# Patient Record
Sex: Male | Born: 1975 | Race: White | Hispanic: No | Marital: Married | Smoking: Current some day smoker
Health system: Southern US, Community
[De-identification: ages and names within clinical notes are randomized; demographics above are authoritative.]

## PROBLEM LIST (undated history)

## (undated) DIAGNOSIS — Z940 Kidney transplant status: Secondary | ICD-10-CM

## (undated) HISTORY — PX: KIDNEY TRANSPLANT: SHX239

## (undated) HISTORY — DX: Kidney transplant status: Z94.0

---

## 1997-10-02 ENCOUNTER — Encounter (HOSPITAL_COMMUNITY): Admission: RE | Admit: 1997-10-02 | Discharge: 1997-12-31 | Payer: Self-pay | Admitting: Nephrology

## 1998-02-11 ENCOUNTER — Ambulatory Visit (HOSPITAL_COMMUNITY): Admission: RE | Admit: 1998-02-11 | Discharge: 1998-02-11 | Payer: Self-pay

## 1998-02-22 ENCOUNTER — Other Ambulatory Visit: Admission: RE | Admit: 1998-02-22 | Discharge: 1998-02-22 | Payer: Self-pay

## 1998-03-22 ENCOUNTER — Encounter (HOSPITAL_COMMUNITY): Admission: RE | Admit: 1998-03-22 | Discharge: 1998-06-20 | Payer: Self-pay

## 1998-11-29 ENCOUNTER — Encounter (HOSPITAL_COMMUNITY): Admission: RE | Admit: 1998-11-29 | Discharge: 1999-01-06 | Payer: Self-pay | Admitting: Nephrology

## 1999-01-09 ENCOUNTER — Encounter (HOSPITAL_COMMUNITY): Admission: RE | Admit: 1999-01-09 | Discharge: 1999-04-09 | Payer: Self-pay

## 2004-11-30 ENCOUNTER — Emergency Department (HOSPITAL_COMMUNITY): Admission: EM | Admit: 2004-11-30 | Discharge: 2004-11-30 | Payer: Self-pay | Admitting: Family Medicine

## 2005-01-05 ENCOUNTER — Emergency Department (HOSPITAL_COMMUNITY): Admission: EM | Admit: 2005-01-05 | Discharge: 2005-01-05 | Payer: Self-pay | Admitting: Family Medicine

## 2005-03-29 ENCOUNTER — Emergency Department (HOSPITAL_COMMUNITY): Admission: EM | Admit: 2005-03-29 | Discharge: 2005-03-29 | Payer: Self-pay | Admitting: Family Medicine

## 2005-05-08 ENCOUNTER — Emergency Department (HOSPITAL_COMMUNITY): Admission: EM | Admit: 2005-05-08 | Discharge: 2005-05-08 | Payer: Self-pay | Admitting: *Deleted

## 2006-05-01 ENCOUNTER — Emergency Department (HOSPITAL_COMMUNITY): Admission: EM | Admit: 2006-05-01 | Discharge: 2006-05-01 | Payer: Self-pay | Admitting: Family Medicine

## 2006-11-20 ENCOUNTER — Emergency Department (HOSPITAL_COMMUNITY): Admission: EM | Admit: 2006-11-20 | Discharge: 2006-11-20 | Payer: Self-pay | Admitting: Family Medicine

## 2007-02-12 ENCOUNTER — Emergency Department (HOSPITAL_COMMUNITY): Admission: EM | Admit: 2007-02-12 | Discharge: 2007-02-12 | Payer: Self-pay | Admitting: Emergency Medicine

## 2007-05-16 ENCOUNTER — Emergency Department (HOSPITAL_COMMUNITY): Admission: EM | Admit: 2007-05-16 | Discharge: 2007-05-16 | Payer: Self-pay | Admitting: Emergency Medicine

## 2008-05-02 ENCOUNTER — Emergency Department (HOSPITAL_COMMUNITY): Admission: EM | Admit: 2008-05-02 | Discharge: 2008-05-02 | Payer: Self-pay | Admitting: Family Medicine

## 2008-06-21 IMAGING — CR DG ANKLE COMPLETE 3+V*L*
3 series · 3 of 3 positions shown · non-contrast
Comparison: none

CLINICAL DATA: Foot pain. Some trauma, left ankle.
 LEFT ANKLE - 3 VIEW:

[view not recorded (1 of 3)]
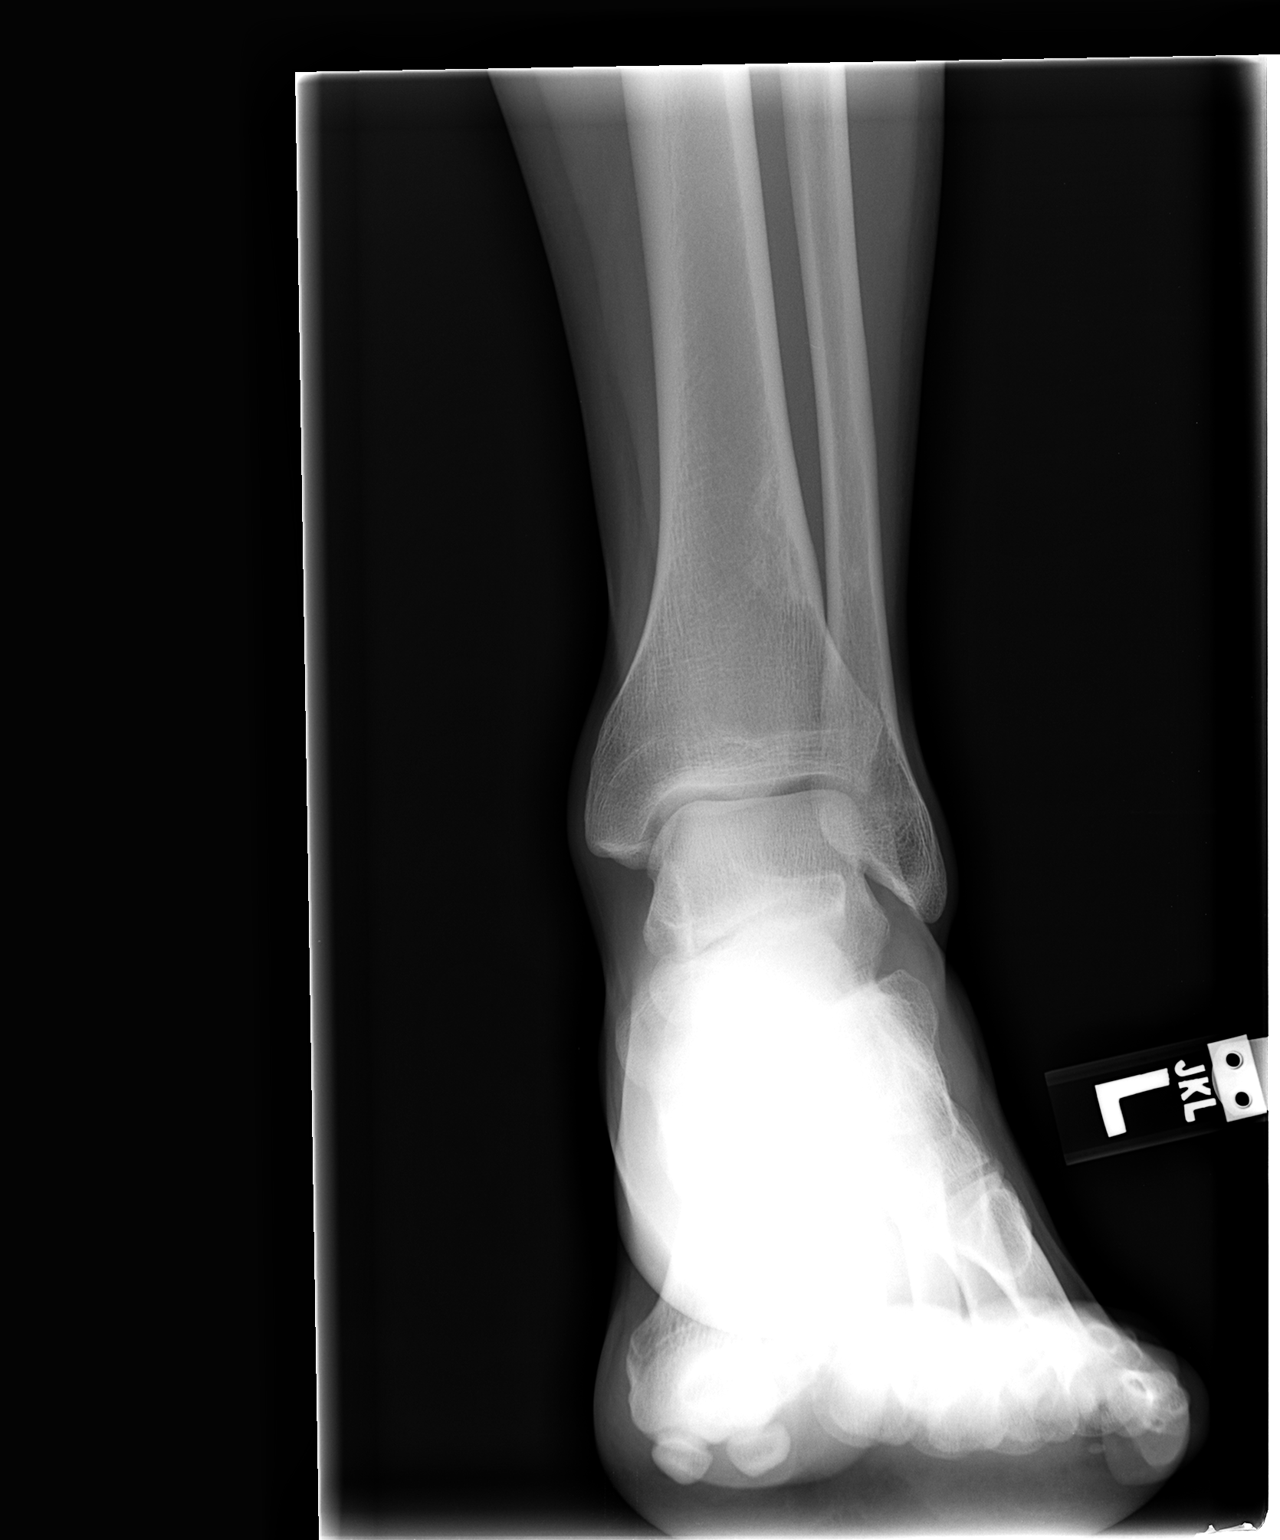

[view not recorded (2 of 3)]
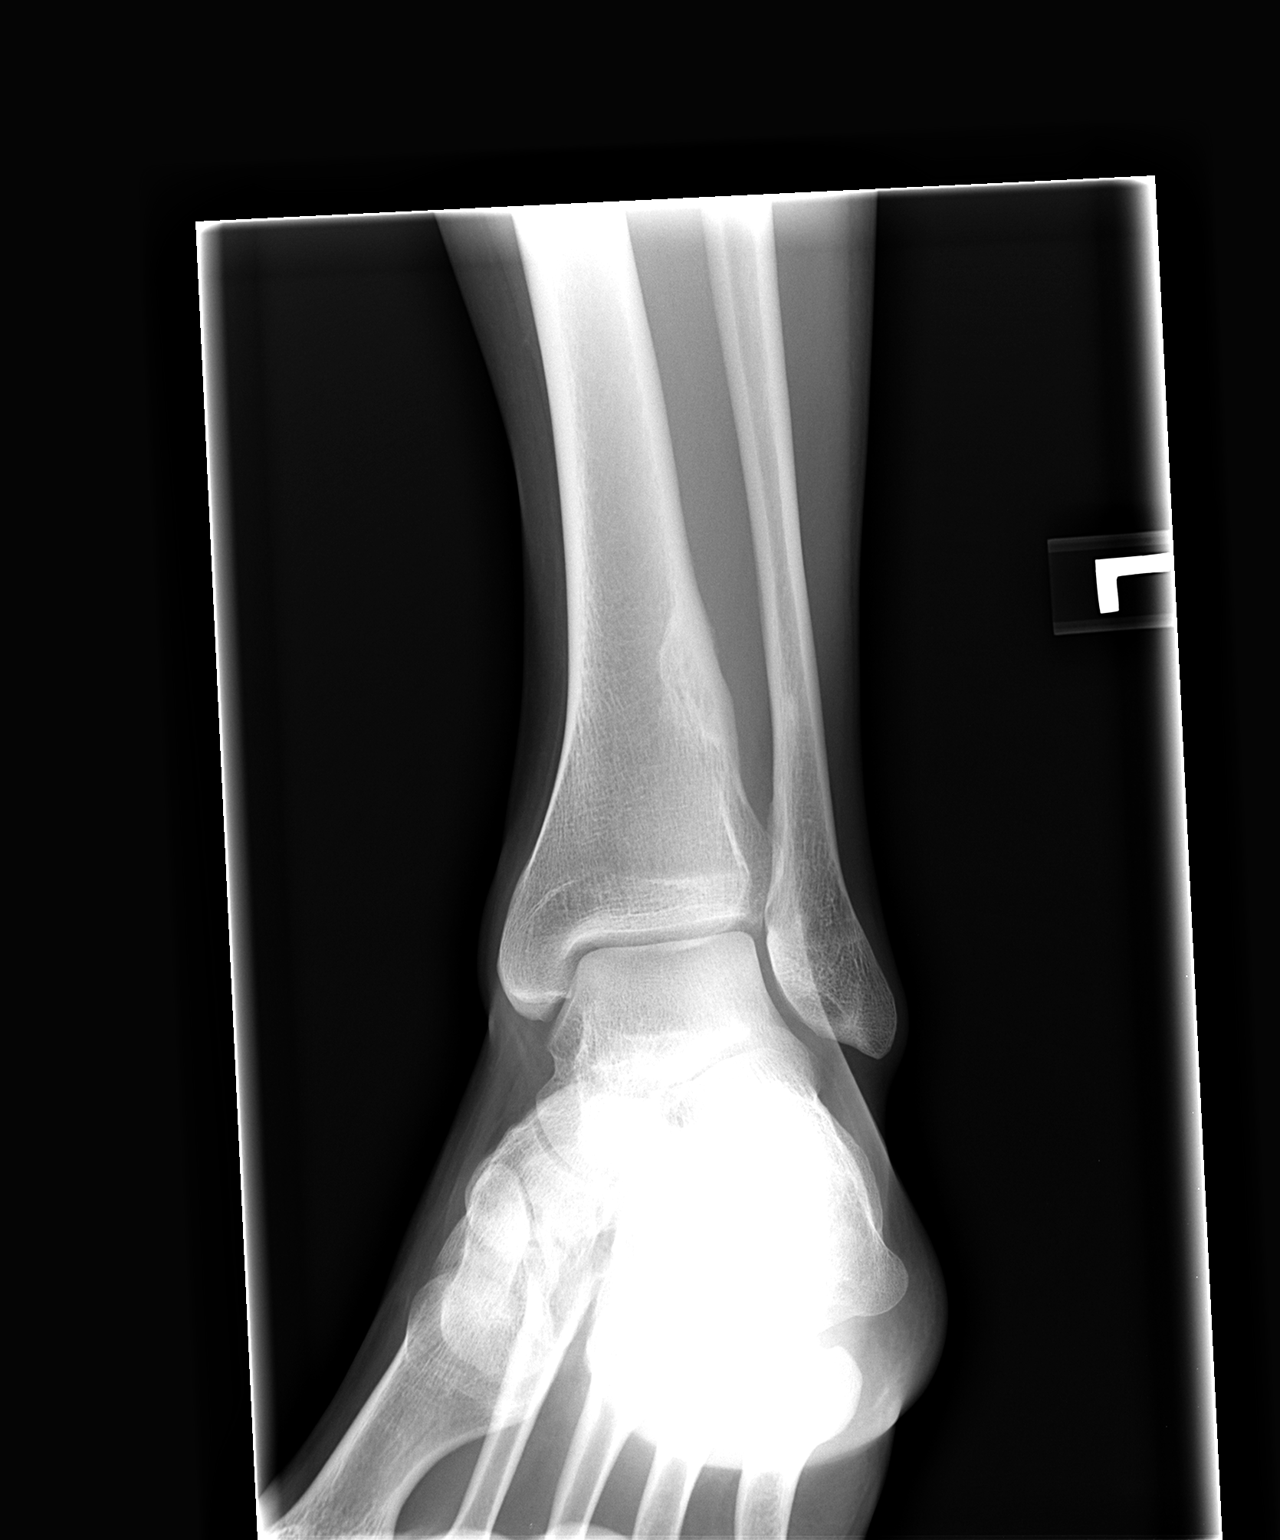

[view not recorded (3 of 3)]
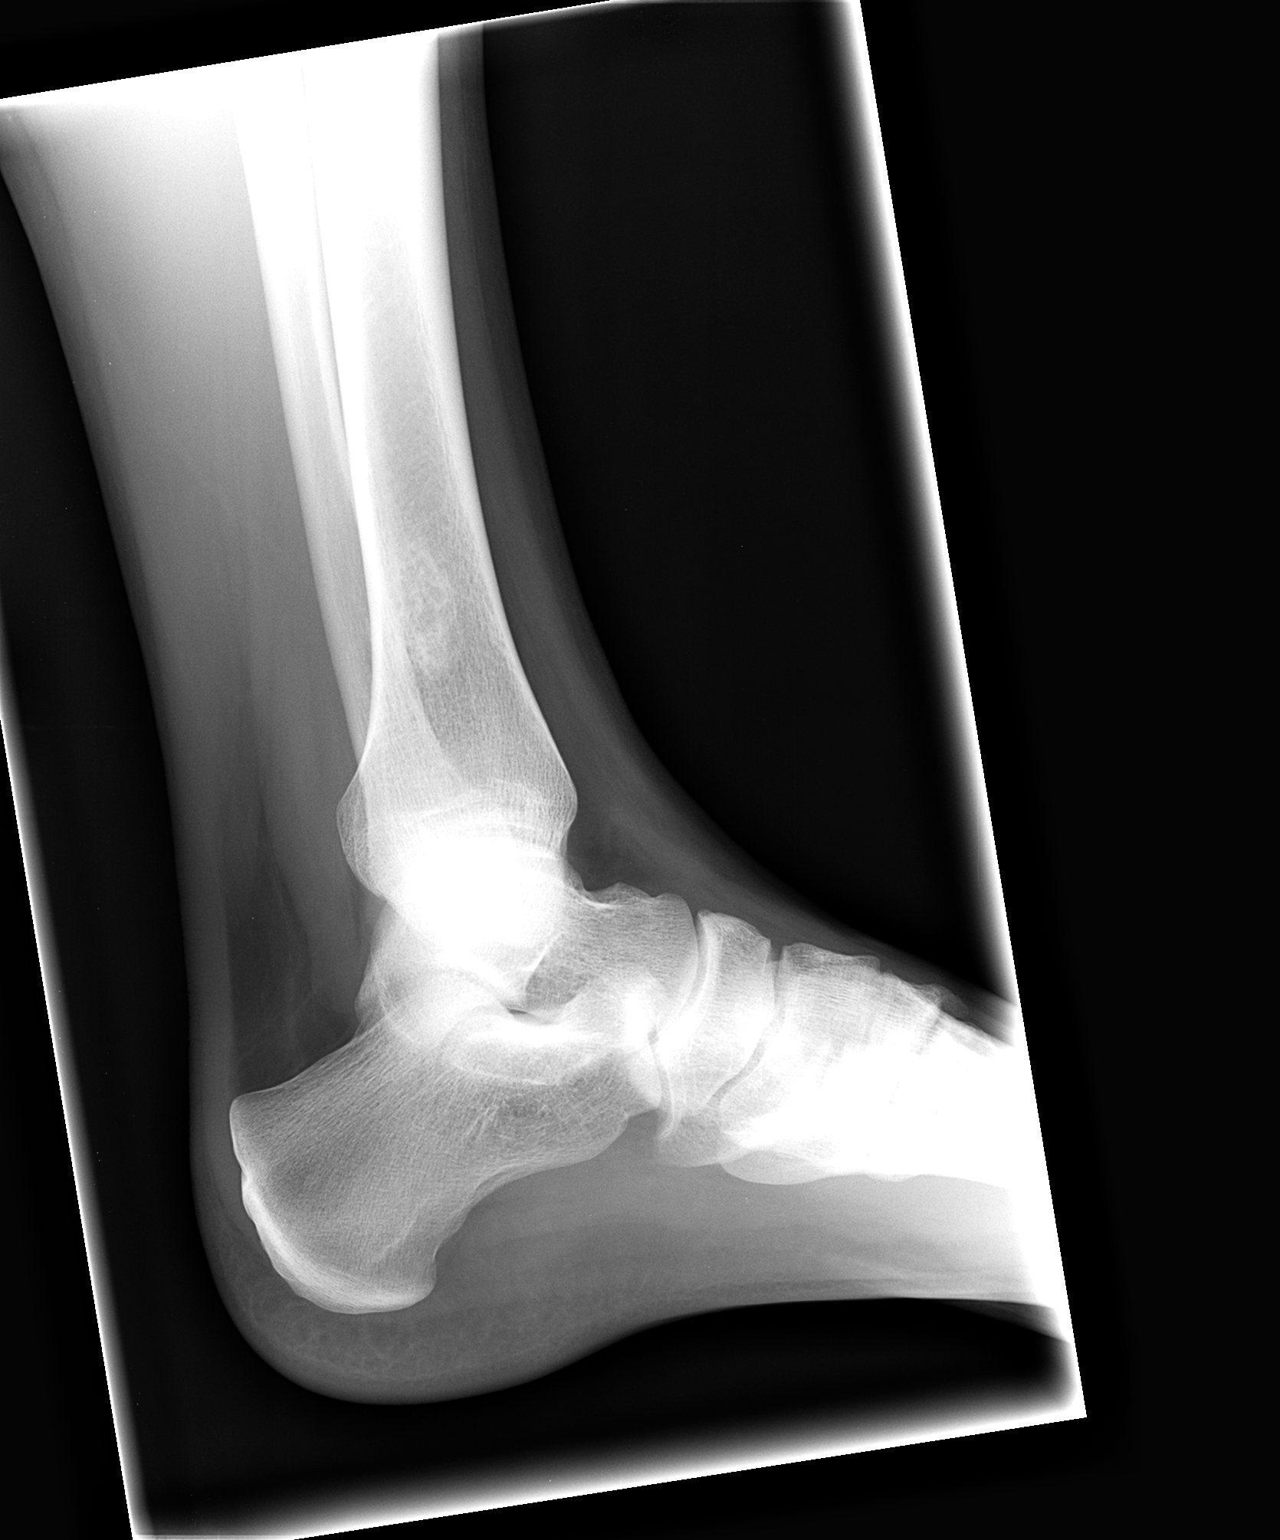

[3 of 3 positions shown; findings below may reference images not displayed]

FINDINGS: Three views of the left ankle were obtained. No acute bony abnormality is seen. The ankle joint appears normal. Thickening of the cortex of the distal medial left tibia is consistent with a benign fibrous cortical defect.
IMPRESSION: No acute abnormality.

## 2009-09-17 ENCOUNTER — Ambulatory Visit: Payer: Self-pay | Admitting: Family Medicine

## 2009-09-17 DIAGNOSIS — J019 Acute sinusitis, unspecified: Secondary | ICD-10-CM

## 2009-12-02 IMAGING — CR DG CHEST 2V
2 series · 2 of 2 positions shown · non-contrast
Comparison: Chest radiographs 05/08/2005.

CLINICAL DATA: Left chest pain and productive cough for 1 week.

CHEST - 2 VIEW

[view not recorded (1 of 2)]
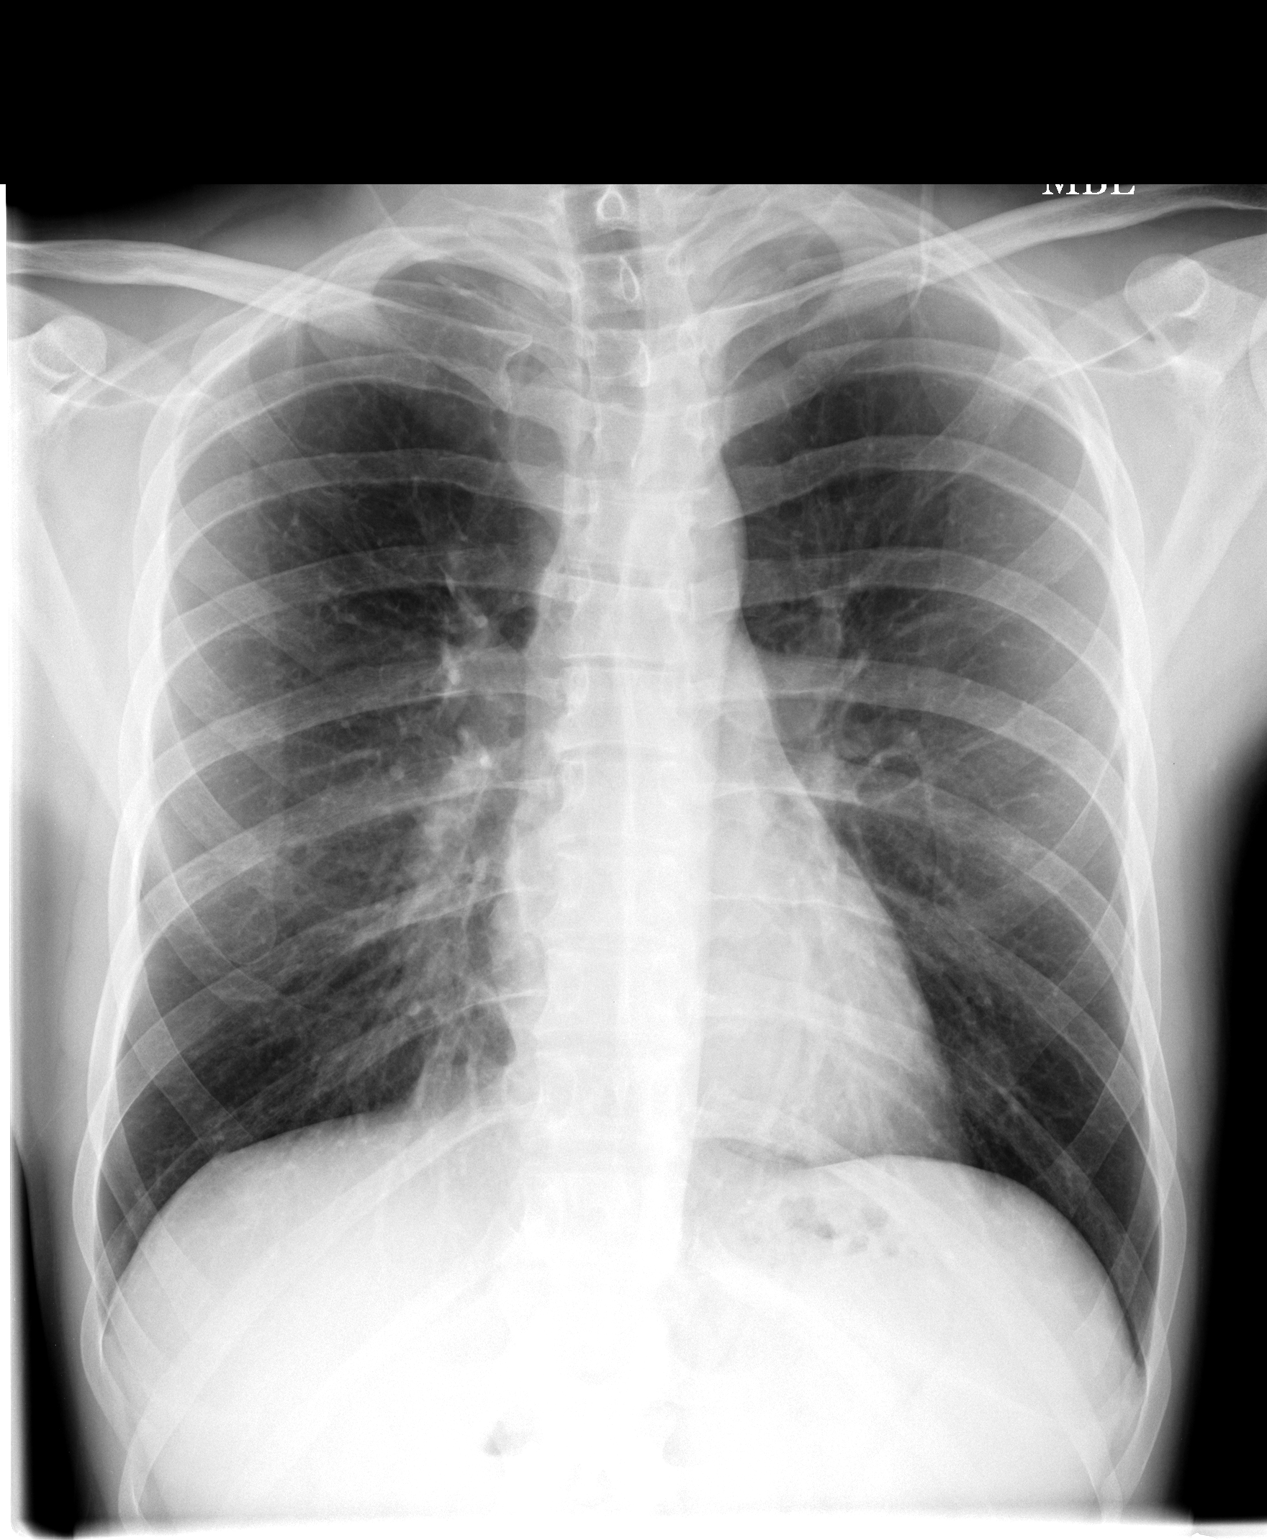

[view not recorded (2 of 2)]
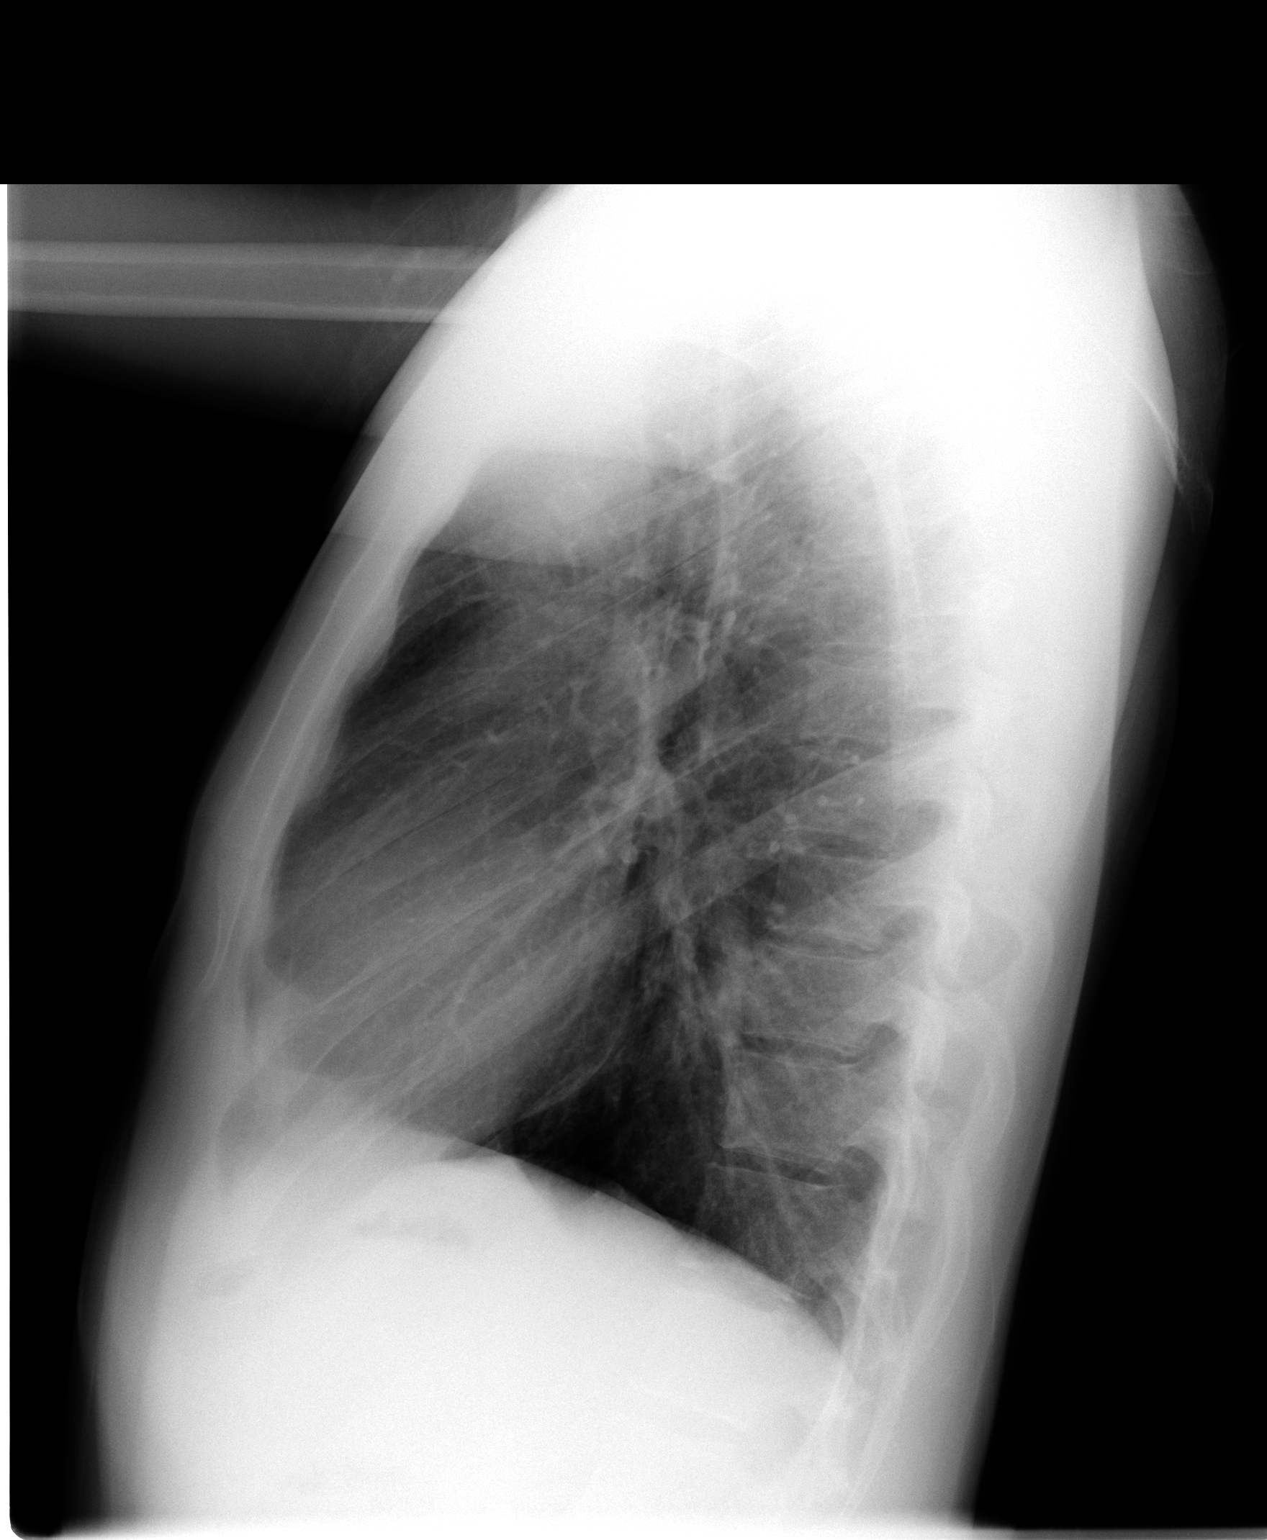

[2 of 2 positions shown; findings below may reference images not displayed]

FINDINGS: The heart size and mediastinal contours are stable.  The
lungs are now clear.  There is no pleural effusion or pneumothorax.
No acute osseous findings are identified.
IMPRESSION: No active cardiopulmonary process.  No evidence of recurrent
pneumonia.

## 2010-07-08 NOTE — Assessment & Plan Note (Signed)
Summary: NOV: Fever, sinusitis   Vital Signs:  Patient profile:   35 year old male Height:      71 inches Weight:      144 pounds BMI:     20.16 O2 Sat:      96 % on Room air Temp:     99.7 degrees F oral Pulse rate:   129 / minute BP sitting:   123 / 75  (left arm) Cuff size:   regular  Vitals Entered By: Kathlene November (September 17, 2009 11:17 AM)  O2 Flow:  Room air CC: NP- low grade temp last 3 days 100.8- bodyaches, no appetite Is Patient Diabetic? No   Primary Care Provider:  Nani Gasser MD  CC:  NP- low grade temp last 3 days 100.8- bodyaches and no appetite.  History of Present Illness: NP- low grade temp last 3 days 101.2- bodyaches, no appetite.  Has pressure in ears and nose, some sinus HA feels constantly couging up phlegm. Occ uses sudafed. Ws really stressed at work last week. Hasn't been sleeping well.  Has been exhausted.  Mild chronic cough. Mild SOB. No sneezing or watery ithcy eyes. Decreased appetite.  Has already had 3 rounds of ABX this winter and really doesn't feel they have helped. Has had amox and zpack.   Habits & Providers  Alcohol-Tobacco-Diet     Alcohol drinks/day: <1     Tobacco Status: current  Exercise-Depression-Behavior     Does Patient Exercise: no     STD Risk: never     Drug Use: no     Seat Belt Use: always  Current Medications (verified): 1)  Cellcept 250 Mg Caps (Mycophenolate Mofetil) .... Take One Tablet By Mouth Twice A Day 2)  Prograf 1 Mg Caps (Tacrolimus) .... Take One Tablet By Mouth Twice A Day 3)  Prednisone 5 Mg Tabs (Prednisone) .... Take One Tablet By Mouth Once A Day 4)  Metoprolol Tartrate 50 Mg Tabs (Metoprolol Tartrate) .... Take One Tbalet By Mouth Once  A Day  Allergies (verified): No Known Drug Allergies  Comments:  Nurse/Medical Assistant: The patient's medications and allergies were reviewed with the patient and were updated in the Medication and Allergy Lists. Kathlene November (September 17, 2009 11:20  AM)  Past History:  Past Medical History: Good pastures Dz - s/p transplant.   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   Family History: None  Social History: Pharmacist, community for [employer].  [place] to [place] with 2 kids.  Occ smokes.   Alcohol use-yes Drug use-no Regular exercise-no 2 caffeinated drinks per day. Smoking Status:  current Does Patient Exercise:  no STD Risk:  never Drug Use:  no Seat Belt Use:  always  Review of Systems       + fever/sweats/weakness, no unexplained weight loss/gain.  No vison changes.  No difficulty hearing/ringing in ears, hay fever/allergies.  No chest pain/discomfort, palpitations.  No Br lump/nipple discharge.  + cough/wheeze.  No blood in BM, nausea/vomiting/diarrhea.  No nighttime urination, leaking urine, unusual vaginal bleeding, discharge (penis or vagina).  No muscle/joint pain. No rash, change in mole.  No HA, memory loss.  No anxiety, sleep d/o, depression.  No easy bruising/bleeding, unexplained lump   Physical Exam  General:  Well-developed,well-nourished,in no acute distress; alert,appropriate and cooperative throughout examination Head:  Normocephalic and atraumatic without obvious abnormalities. No apparent alopecia or balding. Tender over the maxillary sinuses.  Eyes:  No corneal or conjunctival inflammation noted. EOMI. Perrla.  Ears:  External ear exam shows no significant lesions or deformities.  Otoscopic examination reveals clear canals, tympanic membranes are intact bilaterally without bulging, retraction, inflammation or discharge. Hearing is grossly normal bilaterally. Nose:  External nasal examination shows no deformity or inflammation. Nasal mucosa are pink and moist without lesions or exudates. Mouth:  Oral mucosa and oropharynx without lesions or exudates.  Teeth in good repair. Neck:  No deformities, masses, or tenderness  noted. Lungs:  Normal respiratory effort, chest expands symmetrically. Lungs are clear to auscultation, no crackles or wheezes. Heart:  Normal rate and regular rhythm. S1 and S2 normal without gallop, murmur, click, rub or other extra sounds. Skin:  Circular erythematous dry scaling macules approx 0.5 to 1.5 cm scatted over his back and forearms.   Cervical Nodes:  No lymphadenopathy noted Psych:  Cognition and judgment appear intact. Alert and cooperative with normal attention span and concentration. No apparent delusions, illusions, hallucinations   Impression & Recommendations:  Problem # 1:  SINUSITIS - ACUTE-NOS (ICD-461.9) Suspect may have chronic sinusisits. Ths will bee his 4th round of ABX this winter for Sinusitis so fi not getting better then recommend sinus CT. Call if fever persists for more than 2-3 days or if getting worse. Doxy doesn't require renal dosing so this should be ok. He reports his Cr is 2.4 ande has been stable. Unsure of his GFR. I am treating pre-emptivliey since he is immunosupressed.  His updated medication list for this problem includes:    Doxycycline Hyclate 100 Mg Caps (Doxycycline hyclate) .Marland Kitchen... Take 1 tablet by mouth two times a day for 14 days  Instructed on treatment. Call if symptoms persist or worsen.   Complete Medication List: 1)  Cellcept 250 Mg Caps (Mycophenolate mofetil) .... Take one tablet by mouth twice a day 2)  Prograf 1 Mg Caps (Tacrolimus) .... Take one tablet by mouth twice a day 3)  Prednisone 5 Mg Tabs (Prednisone) .... Take one tablet by mouth once a day 4)  Metoprolol Tartrate 50 Mg Tabs (Metoprolol tartrate) .... Take one tbalet by mouth once  a day 5)  Doxycycline Hyclate 100 Mg Caps (Doxycycline hyclate) .... Take 1 tablet by mouth two times a day for 14 days Prescriptions: DOXYCYCLINE HYCLATE 100 MG CAPS (DOXYCYCLINE HYCLATE) Take 1 tablet by mouth two times a day for 14 days  #28 x 0   Entered and Authorized by:   Nani Gasser MD   Signed by:   Nani Gasser MD on 09/17/2009   Method used:   Electronically to        [facility]* (retail)       [address] [place], [place]  [address]       Ph: [phone]       Fax: [phone]   RxID:   [id number]

## 2013-03-21 ENCOUNTER — Encounter: Payer: Self-pay | Admitting: Family Medicine

## 2013-03-21 ENCOUNTER — Ambulatory Visit (INDEPENDENT_AMBULATORY_CARE_PROVIDER_SITE_OTHER): Payer: BC Managed Care – PPO | Admitting: Family Medicine

## 2013-03-21 VITALS — BP 128/79 | HR 95 | Temp 98.1°F | Wt 149.0 lb

## 2013-03-21 DIAGNOSIS — L259 Unspecified contact dermatitis, unspecified cause: Secondary | ICD-10-CM

## 2013-03-21 DIAGNOSIS — L309 Dermatitis, unspecified: Secondary | ICD-10-CM

## 2013-03-21 DIAGNOSIS — I10 Essential (primary) hypertension: Secondary | ICD-10-CM | POA: Insufficient documentation

## 2013-03-21 DIAGNOSIS — Z940 Kidney transplant status: Secondary | ICD-10-CM

## 2013-03-21 DIAGNOSIS — J209 Acute bronchitis, unspecified: Secondary | ICD-10-CM

## 2013-03-21 MED ORDER — TRIAMCINOLONE ACETONIDE 0.1 % EX CREA: TOPICAL_CREAM | CUTANEOUS | Status: AC

## 2013-03-21 MED ORDER — AZITHROMYCIN 250 MG PO TABS: ORAL_TABLET | ORAL | Status: AC

## 2013-03-21 NOTE — Progress Notes (Signed)
CC: Theodore Gillespie is a 37 y.o. male is here for URI   Subjective: HPI:  Patient complains of productive cough it has been present for the last one to 2 weeks working on a daily basis now moderate severity present all days of the week, seems to be worse in the evenings. Has tried DayQuil and NyQuil without any improvement of symptoms. No other interventions nothing else seems to make better or worse. He denies fevers, chills, night sweats, wheezing, blood in sputum, chest pain, nasal congestion, nor postnasal drip. He does point out a rash on his abdomen that has been present for the past one to 2 months it is nonpainful and not itchy. He had dermatology work this up in the past he believes it was eczema and completely resolved after he exposed the skin to the sun multiple days during the summer.   Review Of Systems Outlined In HPI  Past Medical History  Diagnosis Date  . H/O kidney transplant      No family history on file.   History  Substance Use Topics  . Smoking status: Current Some Day Smoker  . Smokeless tobacco: Not on file  . Alcohol Use: Not on file     Objective: Filed Vitals:   03/21/13 0828  BP: 128/79  Pulse: 95  Temp: 98.1 F (36.7 C)    General: Alert and Oriented, No Acute Distress HEENT: Pupils equal, round, reactive to light. Conjunctivae clear.  External ears unremarkable, canals clear with intact TMs with appropriate landmarks.  Middle ear appears open without effusion. Pink inferior turbinates.  Moist mucous membranes, pharynx without inflammation nor lesions.  Neck supple without palpable lymphadenopathy nor abnormal masses. Lungs: Comfortable work of breathing, no wheezing or rales there are mild central rhonchi.  Comfortable work of breathing. Good air movement. Cardiac: Regular rate and rhythm. Normal S1/S2.  No murmurs, rubs, nor gallops.   Extremities: No peripheral edema.  Strong peripheral pulses.  Mental Status: No depression, anxiety, nor  agitation. Skin: Warm and dry. He has about 12 coin-shaped lesions which are are erythematous and scaly on the abdomen and a single lesion on his left shin.  Assessment & Plan: [name] was seen today for uri.  Diagnoses and associated orders for this visit:  History of renal transplant  Acute bronchitis - azithromycin (ZITHROMAX) 250 MG tablet; Take two tabs at once on day 1, then one tab daily on days 2-5.  Dermatitis - triamcinolone cream (KENALOG) 0.1 %; Apply to affected areas twice a day for up to two weeks, avoid face.    Acute bronchitis: Start azithromycin consider Alka-Seltzer cold and sinus. Dermatitis: Lesions on the abdomen looks somewhat like psoriasis versus numular dermatitis, would expect steroid help either, start triamcinolone. Follow up with dermatology if not improving in one week.  Return if symptoms worsen or fail to improve.

## 2013-05-23 ENCOUNTER — Ambulatory Visit (INDEPENDENT_AMBULATORY_CARE_PROVIDER_SITE_OTHER): Payer: BC Managed Care – PPO | Admitting: Family Medicine

## 2013-05-23 ENCOUNTER — Encounter: Payer: Self-pay | Admitting: Family Medicine

## 2013-05-23 VITALS — BP 133/83 | HR 92 | Temp 97.9°F | Wt 156.0 lb

## 2013-05-23 DIAGNOSIS — L309 Dermatitis, unspecified: Secondary | ICD-10-CM

## 2013-05-23 DIAGNOSIS — L259 Unspecified contact dermatitis, unspecified cause: Secondary | ICD-10-CM

## 2013-05-23 DIAGNOSIS — B9689 Other specified bacterial agents as the cause of diseases classified elsewhere: Secondary | ICD-10-CM

## 2013-05-23 DIAGNOSIS — J329 Chronic sinusitis, unspecified: Secondary | ICD-10-CM

## 2013-05-23 DIAGNOSIS — A499 Bacterial infection, unspecified: Secondary | ICD-10-CM

## 2013-05-23 MED ORDER — DOXYCYCLINE HYCLATE 100 MG PO TABS: ORAL_TABLET | ORAL | Status: AC

## 2013-05-23 NOTE — Progress Notes (Signed)
CC: Theodore Gillespie is a 37 y.o. male is here for Cough, Headache, Chills and Fatigue   Subjective: HPI:  Complains of one week of worsening facial pressure localized above both eyes moderate in severity worsen on a daily basis. Present all hours of the day. Accompanied by nasal congestion, sore throat, subjective postnasal drip and dry cough. Reports fatigue but denies chills, fevers, night sweats. Denies shortness of breath wheezing or chest pain denies motor or sensory disturbances   Review Of Systems Outlined In HPI  Past Medical History  Diagnosis Date  . H/O kidney transplant      No family history on file.   History  Substance Use Topics  . Smoking status: Current Some Day Smoker  . Smokeless tobacco: Not on file  . Alcohol Use: Not on file     Objective: Filed Vitals:   05/23/13 1313  BP: 133/83  Pulse: 92  Temp: 97.9 F (36.6 C)    General: Alert and Oriented, No Acute Distress HEENT: Pupils equal, round, reactive to light. Conjunctivae clear.  External ears unremarkable, canals clear with intact TMs with appropriate landmarks.  Middle ear appears open without effusion. Pink inferior turbinates.  Moist mucous membranes, pharynx without inflammation nor lesions however moderate cobblestoning.  Neck supple without palpable lymphadenopathy nor abnormal masses. Lungs: Clear to auscultation bilaterally, no wheezing/ronchi/rales.  Comfortable work of breathing. Good air movement. Cardiac: Regular rate and rhythm. Normal S1/S2.  No murmurs, rubs, nor gallops.   Mental Status: No depression, anxiety, nor agitation. Skin: Warm and dry.  Assessment & Plan: Theodore Gillespie was seen today for cough, headache, chills and fatigue.  Diagnoses and associated orders for this visit:  Bacterial sinusitis - doxycycline (VIBRA-TABS) 100 MG tablet; One by mouth twice a day for ten days.  Dermatitis - Ambulatory referral to Dermatology    Bacterial sinusitis: Start doxycycline and consider  Tylenol instead of ibuprofen or nonsteroidal anti-inflammatories given history of kidney disease. Per his request referral to dermatology for unresolved rash on the abdomen  Return if symptoms worsen or fail to improve.

## 2013-05-25 ENCOUNTER — Telehealth: Payer: Self-pay | Admitting: *Deleted

## 2013-05-25 MED ORDER — FLUTICASONE PROPIONATE 50 MCG/ACT NA SUSP: NASAL | Status: DC

## 2013-05-25 NOTE — Telephone Encounter (Signed)
Pt notified of rx & MD instructions.

## 2013-05-25 NOTE — Telephone Encounter (Signed)
Pt calls & states that since seen on the 16th, he feels like he is getting worse.  He is feeling burning behind his nose, coughing more, his head feels more congested & worse.  He was prescribed doxycycline.

## 2013-05-25 NOTE — Telephone Encounter (Signed)
Continue doxycycline but I am going to add Flonase, a nasal steroid is often required to open the sinus ostia and allow them to drain some of the antibiotics can enter. Return if no better after finishes antibiotics.

## 2014-04-27 ENCOUNTER — Encounter: Payer: Self-pay | Admitting: Family Medicine

## 2014-04-27 ENCOUNTER — Ambulatory Visit (INDEPENDENT_AMBULATORY_CARE_PROVIDER_SITE_OTHER): Payer: BC Managed Care – PPO | Admitting: Family Medicine

## 2014-04-27 ENCOUNTER — Ambulatory Visit: Payer: BC Managed Care – PPO | Admitting: Physician Assistant

## 2014-04-27 VITALS — BP 131/81 | HR 98 | Temp 98.0°F | Wt 151.0 lb

## 2014-04-27 DIAGNOSIS — J329 Chronic sinusitis, unspecified: Secondary | ICD-10-CM

## 2014-04-27 DIAGNOSIS — B9689 Other specified bacterial agents as the cause of diseases classified elsewhere: Secondary | ICD-10-CM

## 2014-04-27 DIAGNOSIS — A499 Bacterial infection, unspecified: Secondary | ICD-10-CM

## 2014-04-27 MED ORDER — DOXYCYCLINE HYCLATE 100 MG PO TABS: ORAL_TABLET | ORAL | Status: AC

## 2014-04-27 NOTE — Progress Notes (Signed)
CC: Orion ModestBrent Hoiland is a 38 y.o. male is here for congeston   Subjective: HPI:  Complains of nasal congestion, postnasal drip, facial pressure localized between the cheeks and within the cheeks that has been present for a little over a week worsening on a daily basis. Nothing particularly makes it better or worse. No interventions as of yet. Symptoms are present all hours of the day but most problematic at night. Denies fevers, chills, cough, shortness of breath, myalgias nor rash   Review Of Systems Outlined In HPI  Past Medical History  Diagnosis Date  . H/O kidney transplant     No past surgical history on file. No family history on file.  History   Social History  . Marital Status: Married    Spouse Name: N/A    Number of Children: N/A  . Years of Education: N/A   Occupational History  . Not on file.   Social History Main Topics  . Smoking status: Current Some Day Smoker  . Smokeless tobacco: Not on file  . Alcohol Use: Not on file  . Drug Use: Not on file  . Sexual Activity: Not on file   Other Topics Concern  . Not on file   Social History Narrative     Objective: BP 131/81 mmHg  Pulse 98  Temp(Src) 98 F (36.7 C) (Oral)  Wt 151 lb (68.493 kg)  General: Alert and Oriented, No Acute Distress HEENT: Pupils equal, round, reactive to light. Conjunctivae clear.  External ears unremarkable, canals clear with intact TMs with appropriate landmarks.  Middle ear appears open without effusion. Pink inferior turbinates with moderate mucoid discharge.  Moist mucous membranes, pharynx without inflammation nor lesions however moderate postnasal drip.  Neck supple without palpable lymphadenopathy nor abnormal masses. Lungs: Clear to auscultation bilaterally, no wheezing/ronchi/rales.  Comfortable work of breathing. Good air movement. Extremities: No peripheral edema.  Strong peripheral pulses.  Mental Status: No depression, anxiety, nor agitation. Skin: Warm and  dry.  Assessment & Plan: Theodore Gillespie was seen today for congeston.  Diagnoses and associated orders for this visit:  Bacterial sinusitis - doxycycline (VIBRA-TABS) 100 MG tablet; One by mouth twice a day for ten days.    Bacterial sinusitis: Start doxycycline and consider nasal saline washes. Restart a 2-3 week regimen of Flonase that he has at home.  Return if symptoms worsen or fail to improve.

## 2014-06-20 ENCOUNTER — Ambulatory Visit (INDEPENDENT_AMBULATORY_CARE_PROVIDER_SITE_OTHER): Payer: BLUE CROSS/BLUE SHIELD | Admitting: Physician Assistant

## 2014-06-20 ENCOUNTER — Encounter: Payer: Self-pay | Admitting: Physician Assistant

## 2014-06-20 VITALS — BP 138/86 | HR 83 | Temp 97.9°F | Wt 160.0 lb

## 2014-06-20 DIAGNOSIS — R059 Cough, unspecified: Secondary | ICD-10-CM

## 2014-06-20 DIAGNOSIS — R05 Cough: Secondary | ICD-10-CM | POA: Diagnosis not present

## 2014-06-20 DIAGNOSIS — A499 Bacterial infection, unspecified: Secondary | ICD-10-CM | POA: Diagnosis not present

## 2014-06-20 DIAGNOSIS — J329 Chronic sinusitis, unspecified: Secondary | ICD-10-CM | POA: Diagnosis not present

## 2014-06-20 DIAGNOSIS — B9689 Other specified bacterial agents as the cause of diseases classified elsewhere: Secondary | ICD-10-CM

## 2014-06-20 MED ORDER — AZITHROMYCIN 250 MG PO TABS: ORAL_TABLET | ORAL | Status: DC

## 2014-06-20 NOTE — Progress Notes (Signed)
   Subjective:    Patient ID: Theodore Gillespie, male    DOB: Jan 15, 1976, 39 y.o.   MRN: [id number]  HPI   Pt presents to the clinic with cough, ST, ear pressure for last week. Not tried anything except tylenol when needed. On low dose of steroid daily. He has pressure all over head. His cough is productive with brownish mucus. Denies any fever. Feels weak and ran down. No wheezing but chest feels tight. He leaves to go sking on Thursday and wants to feel better.         Review of Systems  All other systems reviewed and are negative.      Objective:   Physical Exam  Constitutional: He is oriented to person, place, and time. He appears well-developed and well-nourished.  HENT:  Head: Normocephalic and atraumatic.  Nose: Nose normal.  Mouth/Throat: Oropharynx is clear and moist. No oropharyngeal exudate.  TM's slightly erythematous with air bubbles behind TM's and slight buldge.   Eyes: Conjunctivae are normal. Right eye exhibits no discharge. Left eye exhibits no discharge.  Neck: Normal range of motion. Neck supple.  Cardiovascular: Normal rate, regular rhythm and normal heart sounds.   Pulmonary/Chest: Effort normal and breath sounds normal. He has no wheezes.  Lymphadenopathy:    He has no cervical adenopathy.  Neurological: He is alert and oriented to person, place, and time.  Skin: Skin is dry.  Psychiatric: He has a normal mood and affect. His behavior is normal.          Assessment & Plan:  Bacterial sinusitis/cough- possible ethmoid since pt having a lot of inside head pressure. Will start zpak for 5 days. Suggested flonase for ear pressure. Pt already on low dose prednisone. Follow up if not improving. Symptomatic care given for cough.

## 2014-07-06 ENCOUNTER — Other Ambulatory Visit: Payer: Self-pay | Admitting: *Deleted

## 2014-07-06 ENCOUNTER — Telehealth: Payer: Self-pay

## 2014-07-06 MED ORDER — PREDNISONE 50 MG PO TABS: 50.0000 mg | ORAL_TABLET | Freq: Every day | ORAL | Status: DC

## 2014-07-06 MED ORDER — AMOXICILLIN-POT CLAVULANATE 875-125 MG PO TABS: 1.0000 | ORAL_TABLET | Freq: Two times a day (BID) | ORAL | Status: DC

## 2014-07-06 NOTE — Telephone Encounter (Signed)
Patient advised.

## 2014-07-06 NOTE — Telephone Encounter (Signed)
Theodore Gillespie states he is still sick with cough, pressure in face and congestion. He would like another antibiotic sent to pharmacy. Please advise.

## 2014-07-06 NOTE — Telephone Encounter (Signed)
Ok for augmentin 875mg  i po bid for 10 days #20 NF and prednisone 50mg  I po qd for 5 days. NF. Follow up with office visit if not improving.

## 2016-02-11 ENCOUNTER — Ambulatory Visit (INDEPENDENT_AMBULATORY_CARE_PROVIDER_SITE_OTHER): Payer: No Typology Code available for payment source | Admitting: Family Medicine

## 2016-02-11 ENCOUNTER — Encounter: Payer: Self-pay | Admitting: Family Medicine

## 2016-02-11 VITALS — BP 116/68 | HR 97 | Temp 98.8°F | Wt 162.0 lb

## 2016-02-11 DIAGNOSIS — J069 Acute upper respiratory infection, unspecified: Secondary | ICD-10-CM | POA: Diagnosis not present

## 2016-02-11 DIAGNOSIS — Z940 Kidney transplant status: Secondary | ICD-10-CM

## 2016-02-11 DIAGNOSIS — Z23 Encounter for immunization: Secondary | ICD-10-CM | POA: Diagnosis not present

## 2016-02-11 NOTE — Patient Instructions (Addendum)
Allergic Rhinitis Allergic rhinitis is when the mucous membranes in the nose respond to allergens. Allergens are particles in the air that cause your body to have an allergic reaction. This causes you to release allergic antibodies. Through a chain of events, these eventually cause you to release histamine into the blood stream. Although meant to protect the body, it is this release of histamine that causes your discomfort, such as frequent sneezing, congestion, and an itchy, runny nose.  CAUSES Seasonal allergic rhinitis (hay fever) is caused by pollen allergens that may come from grasses, trees, and weeds. Year-round allergic rhinitis (perennial allergic rhinitis) is caused by allergens such as house dust mites, pet dander, and mold spores. SYMPTOMS  Nasal stuffiness (congestion).  Itchy, runny nose with sneezing and tearing of the eyes. DIAGNOSIS Your health care provider can help you determine the allergen or allergens that trigger your symptoms. If you and your health care provider are unable to determine the allergen, skin or blood testing may be used. Your health care provider will diagnose your condition after taking your health history and performing a physical exam. Your health care provider may assess you for other related conditions, such as asthma, pink eye, or an ear infection. TREATMENT Allergic rhinitis does not have a cure, but it can be controlled by:  Medicines that block allergy symptoms. These may include allergy shots, nasal sprays, and oral antihistamines.  Avoiding the allergen. Hay fever may often be treated with antihistamines in pill or nasal spray forms. Antihistamines block the effects of histamine. There are over-the-counter medicines that may help with nasal congestion and swelling around the eyes. Check with your health care provider before taking or giving this medicine. If avoiding the allergen or the medicine prescribed do not work, there are many new medicines  your health care provider can prescribe. Stronger medicine may be used if initial measures are ineffective. Desensitizing injections can be used if medicine and avoidance does not work. Desensitization is when a patient is given ongoing shots until the body becomes less sensitive to the allergen. Make sure you follow up with your health care provider if problems continue. HOME CARE INSTRUCTIONS It is not possible to completely avoid allergens, but you can reduce your symptoms by taking steps to limit your exposure to them. It helps to know exactly what you are allergic to so that you can avoid your specific triggers. SEEK MEDICAL CARE IF:  You have a fever.  You develop a cough that does not stop easily (persistent).  You have shortness of breath.  You start wheezing.  Symptoms interfere with normal daily activities.   This information is not intended to replace advice given to you by your health care provider. Make sure you discuss any questions you have with your health care provider.   Document Released: 02/17/2001 Document Revised: 06/15/2014 Document Reviewed: 01/30/2013 Elsevier Interactive Patient Education 2016 [employer]  

## 2016-02-11 NOTE — Progress Notes (Signed)
   Subjective:    Patient ID: Theodore Gillespie, male    DOB: 03-07-1976, 40 y.o.   MRN: [id number]  HPI 40 year old male with a history of renal transplant comes in today complaining of sxs x3 days sneezing, chest and head congestion. he has been using mucinex and sudafed denies any fevers. He says his eyes have been watering though no itching. And he's been sneezing a lot. He says usually he doesn't stasis much when he just gets a cold. No fevers chills or sweats. No diarrhea. No worsening or alleviating factors.   Review of Systems     Objective:   Physical Exam  Constitutional: He is oriented to person, place, and time. He appears well-developed and well-nourished.  HENT:  Head: Normocephalic and atraumatic.  Right Ear: External ear normal.  Left Ear: External ear normal.  Nose: Nose normal.  Mouth/Throat: Oropharynx is clear and moist.  TMs and canals are clear.   Eyes: Conjunctivae and EOM are normal. Pupils are equal, round, and reactive to light.  Neck: Neck supple. No thyromegaly present.  Cardiovascular: Normal rate and normal heart sounds.   Pulmonary/Chest: Effort normal and breath sounds normal.  Lymphadenopathy:    He has no cervical adenopathy.  Neurological: He is alert and oriented to person, place, and time.  Skin: Skin is warm and dry.  Psychiatric: He has a normal mood and affect.       Assessment & Plan:  URI with some component of AR. - Recommend a trial of an antihistamine such as Claritin and Zyrtec or Allegra. If he is not feeling better by the end of the week or suddenly gets worse then please let us know. Otherwise recommend symptomatic care.

## 2016-03-23 ENCOUNTER — Telehealth: Payer: Self-pay | Admitting: Family Medicine

## 2016-03-23 DIAGNOSIS — R05 Cough: Secondary | ICD-10-CM

## 2016-03-23 DIAGNOSIS — R058 Other specified cough: Secondary | ICD-10-CM

## 2016-03-23 NOTE — Telephone Encounter (Signed)
.   Was dx with allergies and URI 6 weeks ago.  What sxs is he having specifically now?

## 2016-03-23 NOTE — Telephone Encounter (Signed)
Pt called stating that he came in about a month ago with sinus/cold issues and is still not feeling better. I tried to make him a  FU appt but he said he was told to just call in to let you know if he wasn't feeling better. He wanted to see what you thought before making an appointment. I told him I'd send you a phone note. Let me know if you'd like me to call him back to schedule. Thanks!

## 2016-03-24 NOTE — Telephone Encounter (Signed)
Let have him go for chest xray. Please place order.

## 2016-03-24 NOTE — Telephone Encounter (Signed)
Order placed. Left a message advising patient to come in for a chest xray.

## 2016-03-24 NOTE — Telephone Encounter (Signed)
Patient states the only symptom he is having is productive cough with yellow sputum. Denies fever, chills, sweats, wheezing, nausea or vomiting.

## 2016-03-27 ENCOUNTER — Ambulatory Visit (INDEPENDENT_AMBULATORY_CARE_PROVIDER_SITE_OTHER): Payer: No Typology Code available for payment source

## 2016-03-27 ENCOUNTER — Other Ambulatory Visit: Payer: Self-pay | Admitting: Family Medicine

## 2016-03-27 DIAGNOSIS — R05 Cough: Secondary | ICD-10-CM

## 2016-03-27 DIAGNOSIS — R058 Other specified cough: Secondary | ICD-10-CM

## 2016-03-27 MED ORDER — DOXYCYCLINE HYCLATE 100 MG PO TABS: 100.0000 mg | ORAL_TABLET | Freq: Two times a day (BID) | ORAL | 0 refills | Status: DC

## 2016-03-27 NOTE — Progress Notes (Signed)
Doxy sent to pharmacy 

## 2017-07-19 ENCOUNTER — Ambulatory Visit (INDEPENDENT_AMBULATORY_CARE_PROVIDER_SITE_OTHER): Payer: No Typology Code available for payment source

## 2017-07-19 ENCOUNTER — Ambulatory Visit (INDEPENDENT_AMBULATORY_CARE_PROVIDER_SITE_OTHER): Payer: No Typology Code available for payment source | Admitting: Sports Medicine

## 2017-07-19 ENCOUNTER — Encounter: Payer: Self-pay | Admitting: Sports Medicine

## 2017-07-19 DIAGNOSIS — J111 Influenza due to unidentified influenza virus with other respiratory manifestations: Secondary | ICD-10-CM

## 2017-07-19 DIAGNOSIS — R69 Illness, unspecified: Secondary | ICD-10-CM | POA: Diagnosis not present

## 2017-07-19 DIAGNOSIS — R509 Fever, unspecified: Secondary | ICD-10-CM | POA: Diagnosis not present

## 2017-07-19 NOTE — Assessment & Plan Note (Signed)
With high fevers, this likely represented influenza, took some of her friends Tamiflu, he will finish the course. 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 Return to see us if symptoms do not resolve by 1 week.

## 2017-07-19 NOTE — Progress Notes (Signed)
  Subjective:    CC: Flulike symptoms  HPI: Kipp BroodBrent is a pleasant 42 year old male, earlier last week he started to develop fevers, chills, muscle aches, body aches and a cough.  He started to take some of her friends Tamiflu, and started to feel better very quickly.  At this point he only has a mild cough, muscle aches, body aches are resolved, fevers initially were up to 102 Fahrenheit which are also resolved now.  Feeling much better but would like me to look him over.  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  I reviewed the past medical history, family history, social history, surgical history, and allergies today and no changes were needed.  Please see the problem list section below in epic for further details.  Past Medical History: Past Medical History:  Diagnosis Date  . H/O kidney transplant    Past Surgical History: Past Surgical History:  Procedure Laterality Date  . KIDNEY TRANSPLANT     Social History: Social History   Socioeconomic History  . Marital status: Married    Spouse name: None  . Number of children: None  . Years of education: None  . Highest education level: None  Social Needs  . Financial resource strain: None  . Food insecurity - worry: None  . Food insecurity - inability: None  . Transportation needs - medical: None  . Transportation needs - non-medical: None  Occupational History  . None  Tobacco Use  . Smoking status: Current Some Day Smoker  Substance and Sexual Activity  . Alcohol use: None  . Drug use: None  . Sexual activity: None  Other Topics Concern  . None  Social History Narrative  . None   Family History: No family history on file. Allergies: No Known Allergies Medications: See med rec.  Review of Systems: No fevers, chills, night sweats, weight loss, chest pain, or shortness of breath.   Objective:    General: Well Developed, well nourished, and in no acute distress.  Neuro: Alert and oriented x3, extra-ocular muscles  intact, sensation grossly intact.  HEENT: Normocephalic, atraumatic, pupils equal round reactive to light, neck supple, no masses, no lymphadenopathy, thyroid nonpalpable.  Oropharynx, nasopharynx, ear canals unremarkable. Skin: Warm and dry, no rashes. Cardiac: Regular rate and rhythm, no murmurs rubs or gallops, no lower extremity edema.  Respiratory: Clear to auscultation bilaterally. Not using accessory muscles, speaking in full sentences.  Impression and Recommendations:    Influenza-like illness With high fevers, this likely represented influenza, took some of her friends Tamiflu, he will finish the course. 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 Return to see us if symptoms do not resolve by 1 week.  I spent 25 minutes with this patient, greater than 50% was face-to-face time counseling regarding the above diagnoses ___________________________________________ Ihor Austinhomas J. Benjamin Stainhekkekandam, M.D., ABFM., CAQSM. Primary Care and Sports Medicine [facility]  Adjunct Instructor of Family Medicine  [employer]

## 2018-08-02 ENCOUNTER — Ambulatory Visit (INDEPENDENT_AMBULATORY_CARE_PROVIDER_SITE_OTHER): Payer: No Typology Code available for payment source | Admitting: Sports Medicine

## 2018-08-02 ENCOUNTER — Encounter: Payer: Self-pay | Admitting: Sports Medicine

## 2018-08-02 DIAGNOSIS — S39012A Strain of muscle, fascia and tendon of lower back, initial encounter: Secondary | ICD-10-CM | POA: Diagnosis not present

## 2018-08-02 MED ORDER — PREDNISONE 50 MG PO TABS: ORAL_TABLET | ORAL | 0 refills | Status: AC

## 2018-08-02 NOTE — Assessment & Plan Note (Signed)
Occurred while shoveling gravel. Nothing radicular, no bowel or bladder dysfunction. Prednisone 50 mg daily for 5 days then he will drop back down to his daily 5 mg dose. X-rays, rehab exercises given. History of renal transplant so avoiding NSAIDs. Return to see me as needed in a couple of weeks.

## 2018-08-02 NOTE — Progress Notes (Signed)
Subjective:    CC: Acute low back pain  HPI: This is a pleasant 43 year old male, recently he was shortness of breath, felt severe pain in his right low back, no radiation down the leg, no bowel or bladder dysfunction, saddle numbness, constitutional symptoms.  He is post renal transplant.  Symptoms are severe, persistent, localized without radiation.  I reviewed the past medical history, family history, social history, surgical history, and allergies today and no changes were needed.  Please see the problem list section below in epic for further details.  Past Medical History: Past Medical History:  Diagnosis Date  . H/O kidney transplant    Past Surgical History: Past Surgical History:  Procedure Laterality Date  . KIDNEY TRANSPLANT     Social History: Social History   Socioeconomic History  . Marital status: Married    Spouse name: Not on file  . Number of children: Not on file  . Years of education: Not on file  . Highest education level: Not on file  Occupational History  . Not on file  Social Needs  . Financial resource strain: Not on file  . Food insecurity:    Worry: Not on file    Inability: Not on file  . Transportation needs:    Medical: Not on file    Non-medical: Not on file  Tobacco Use  . Smoking status: Current Some Day Smoker  . Smokeless tobacco: Never Used  Substance and Sexual Activity  . Alcohol use: Not on file  . Drug use: Not on file  . Sexual activity: Not on file  Lifestyle  . Physical activity:    Days per week: Not on file    Minutes per session: Not on file  . Stress: Not on file  Relationships  . Social connections:    Talks on phone: Not on file    Gets together: Not on file    Attends religious service: Not on file    Active member of club or organization: Not on file    Attends meetings of clubs or organizations: Not on file    Relationship status: Not on file  Other Topics Concern  . Not on file  Social History Narrative    . Not on file   Family History: No family history on file. Allergies: No Known Allergies Medications: See med rec.  Review of Systems: No fevers, chills, night sweats, weight loss, chest pain, or shortness of breath.   Objective:    General: Well Developed, well nourished, and in no acute distress.  Neuro: Alert and oriented x3, extra-ocular muscles intact, sensation grossly intact.  HEENT: Normocephalic, atraumatic, pupils equal round reactive to light, neck supple, no masses, no lymphadenopathy, thyroid nonpalpable.  Skin: Warm and dry, no rashes. Cardiac: Regular rate and rhythm, no murmurs rubs or gallops, no lower extremity edema.  Respiratory: Clear to auscultation bilaterally. Not using accessory muscles, speaking in full sentences. Back Exam:  Inspection: Unremarkable  Motion: Flexion 45 deg, Extension 45 deg, Side Bending to 45 deg bilaterally,  Rotation to 45 deg bilaterally  SLR laying: Negative  XSLR laying: Negative  Palpable tenderness: Right lower paralumbar muscles.Marland Kitchen FABER: negative. Sensory change: Gross sensation intact to all lumbar and sacral dermatomes.  Reflexes: 2+ at both patellar tendons, 2+ at achilles tendons, Babinski's downgoing.  Strength at foot  Plantar-flexion: 5/5 Dorsi-flexion: 5/5 Eversion: 5/5 Inversion: 5/5  Leg strength  Quad: 5/5 Hamstring: 5/5 Hip flexor: 5/5 Hip abductors: 5/5  Gait unremarkable.  Impression and  Recommendations:    Lumbar strain Occurred while shoveling gravel. Nothing radicular, no bowel or bladder dysfunction. Prednisone 50 mg daily for 5 days then he will drop back down to his daily 5 mg dose. X-rays, rehab exercises given. History of renal transplant so avoiding NSAIDs. Return to see me as needed in a couple of weeks. ___________________________________________ Ihor Austin. Benjamin Stain, M.D., ABFM., CAQSM. Primary Care and Sports Medicine [facility]  Adjunct Professor of Family  Medicine  [employer]
